# Patient Record
Sex: Male | Born: 1985 | Race: Black or African American | Hispanic: No | Marital: Single | Smoking: Former smoker
Health system: Southern US, Community
[De-identification: ages and names within clinical notes are randomized; demographics above are authoritative.]

## PROBLEM LIST (undated history)

## (undated) DIAGNOSIS — J45909 Unspecified asthma, uncomplicated: Secondary | ICD-10-CM

---

## 2010-05-23 ENCOUNTER — Ambulatory Visit: Payer: Self-pay | Admitting: Diagnostic Radiology

## 2010-05-23 ENCOUNTER — Emergency Department (HOSPITAL_BASED_OUTPATIENT_CLINIC_OR_DEPARTMENT_OTHER)
Admission: EM | Admit: 2010-05-23 | Discharge: 2010-05-23 | Payer: Self-pay | Source: Home / Self Care | Admitting: Emergency Medicine
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
## 2012-04-10 ENCOUNTER — Encounter (HOSPITAL_BASED_OUTPATIENT_CLINIC_OR_DEPARTMENT_OTHER): Payer: Self-pay | Admitting: *Deleted

## 2012-04-10 ENCOUNTER — Emergency Department (HOSPITAL_BASED_OUTPATIENT_CLINIC_OR_DEPARTMENT_OTHER)
Admission: EM | Admit: 2012-04-10 | Discharge: 2012-04-10 | Disposition: A | Discharge status: Home / Self Care | Payer: Self-pay | Attending: Emergency Medicine | Admitting: Emergency Medicine

## 2012-04-10 DIAGNOSIS — Z87891 Personal history of nicotine dependence: Secondary | ICD-10-CM | POA: Insufficient documentation

## 2012-04-10 DIAGNOSIS — S61219A Laceration without foreign body of unspecified finger without damage to nail, initial encounter: Secondary | ICD-10-CM

## 2012-04-10 DIAGNOSIS — S61209A Unspecified open wound of unspecified finger without damage to nail, initial encounter: Secondary | ICD-10-CM | POA: Insufficient documentation

## 2012-04-10 DIAGNOSIS — Z23 Encounter for immunization: Secondary | ICD-10-CM | POA: Insufficient documentation

## 2012-04-10 DIAGNOSIS — W278XXA Contact with other nonpowered hand tool, initial encounter: Secondary | ICD-10-CM | POA: Insufficient documentation

## 2012-04-10 MED ORDER — TETANUS-DIPHTH-ACELL PERTUSSIS 5-2.5-18.5 LF-MCG/0.5 IM SUSP
0.5000 mL | Freq: Once | INTRAMUSCULAR | Status: AC
  Administered 2012-04-10: 0.5 mL via INTRAMUSCULAR
  Filled 2012-04-10: qty 0.5

## 2012-04-10 NOTE — ED Provider Notes (Signed)
Medical screening examination/treatment/procedure(s) were performed by non-physician practitioner and as supervising physician I was immediately available for consultation/collaboration.  Ethelda Chick, MD 04/10/12 1331

## 2012-04-10 NOTE — ED Notes (Signed)
Patient has laceration from screwdriver on the left index finger; injury is covered with bandaid.

## 2012-04-10 NOTE — ED Provider Notes (Signed)
History     CSN: [id number]  Arrival date & time 04/10/12  1138   First MD Initiated Contact with Patient 04/10/12 1225      Chief Complaint  Patient presents with  . Finger Injury    (Consider location/radiation/quality/duration/timing/severity/associated sxs/prior treatment) HPI Patient since emergency department with laceration to his left index finger that occurred last night, around 8 PM.  Patient, states his tetanus shot is not up to date patient denies any numbness, or weakness in the finger.  Patient, states he has full range of motion of the finger.  History reviewed. No pertinent past medical history.  History reviewed. No pertinent past surgical history.  No family history on file.  History  Substance Use Topics  . Smoking status: Former Games developer  . Smokeless tobacco: Not on file  . Alcohol Use: Yes     occassionally      Review of Systems All other systems negative except as documented in the HPI. All pertinent positives and negatives as reviewed in the HPI.  Allergies  Review of patient's allergies indicates no known allergies.  Home Medications  No current outpatient prescriptions on file.  BP 118/64  Pulse 86  Temp 98.7 F (37.1 C) (Oral)  Resp 18  Ht 5\' 6"  (1.676 m)  Wt 135 lb (61.236 kg)  BMI 21.79 kg/m2  SpO2 99%  Physical Exam  Nursing note and vitals reviewed. Constitutional: He appears well-developed and well-nourished.  HENT:  Head: Normocephalic and atraumatic.  Cardiovascular: Normal rate and regular rhythm.   Pulmonary/Chest: Effort normal and breath sounds normal.  Musculoskeletal:       Left hand: He exhibits laceration. He exhibits normal two-point discrimination. normal sensation noted. Normal strength noted.       Hands:   ED Course  Procedures (including critical care time)  Patient had late presentation since the onset of the injury was 8 PM last night.  Patient's over 12 hours since injury time and will have  Steri-Strips placed along with wound cleansing and care.  Patient is advised return here as needed.  Tetanus shot will be updated   MDM          Carlyle Dolly, PA-C 04/10/12 1324

## 2012-04-10 NOTE — ED Notes (Signed)
Patient states he cut his left index finger last night while working on his car.

## 2017-02-14 ENCOUNTER — Encounter (HOSPITAL_BASED_OUTPATIENT_CLINIC_OR_DEPARTMENT_OTHER): Payer: Self-pay | Admitting: *Deleted

## 2017-02-14 ENCOUNTER — Emergency Department (HOSPITAL_BASED_OUTPATIENT_CLINIC_OR_DEPARTMENT_OTHER): Payer: Self-pay

## 2017-02-14 ENCOUNTER — Emergency Department (HOSPITAL_BASED_OUTPATIENT_CLINIC_OR_DEPARTMENT_OTHER)
Admission: EM | Admit: 2017-02-14 | Discharge: 2017-02-14 | Disposition: A | Discharge status: Home / Self Care | Payer: Self-pay | Attending: Emergency Medicine | Admitting: Emergency Medicine

## 2017-02-14 DIAGNOSIS — J45909 Unspecified asthma, uncomplicated: Secondary | ICD-10-CM | POA: Insufficient documentation

## 2017-02-14 DIAGNOSIS — Z87891 Personal history of nicotine dependence: Secondary | ICD-10-CM | POA: Insufficient documentation

## 2017-02-14 DIAGNOSIS — R0789 Other chest pain: Secondary | ICD-10-CM | POA: Insufficient documentation

## 2017-02-14 HISTORY — DX: Unspecified asthma, uncomplicated: J45.909

## 2017-02-14 MED ORDER — IBUPROFEN 800 MG PO TABS
800.0000 mg | ORAL_TABLET | Freq: Once | ORAL | Status: AC
  Administered 2017-02-14: 800 mg via ORAL
  Filled 2017-02-14: qty 1

## 2017-02-14 MED ORDER — TRAMADOL HCL 50 MG PO TABS: 50.0000 mg | ORAL_TABLET | Freq: Four times a day (QID) | ORAL | 0 refills | Status: AC | PRN

## 2017-02-14 NOTE — Discharge Instructions (Signed)
Apply ice several times a day. Take two naproxen tablets at a time, twice a day. Take acetaminophen every four hours as needed for additional pain relief. Reserve tramadol for severe pain, or for help sleeping.

## 2017-02-14 NOTE — ED Triage Notes (Addendum)
Pt c/o sudden onset of right chest  Wall pain with heavy lifting x 8 hrs ago

## 2017-02-14 NOTE — ED Notes (Signed)
Patient transported to X-ray 

## 2017-02-14 NOTE — ED Provider Notes (Signed)
[facility]   CSN: [id number] Arrival date & time: 02/14/17  1446     History   Chief Complaint Chief Complaint  Patient presents with  . Chest Pain    right side    HPI Michael MorgansRasheik D Edmiston is a 31 y.o. male.  The history is provided by the patient.  He woke up this morning with pain in the right anterior chest wall area. Pain got worse when he did some lifting at work, and he felt like he couldn't catch his breath. His work does involve lifting, but he cannot recall any specific thing he did that may hurt his chest. He rates pain at 6/10. Pain is sharp and worse with a deep breath and worse with movement. It is also worse with movement of his right arm. He denies cough. He denies nausea, vomiting, diaphoresis. He is a nonsmoker with no history of diabetes or hypertension or hyperlipidemia. No family history of premature coronary atherosclerosis. No history of recent surgery or long distance travel. No history of prior DVT or PE.  Past Medical History:  Diagnosis Date  . Asthma     There are no active problems to display for this patient.   History reviewed. No pertinent surgical history.     Home Medications    Prior to Admission medications   Not on File    Family History No family history on file.  Social History Social History  Substance Use Topics  . Smoking status: Former Games developermoker  . Smokeless tobacco: Not on file  . Alcohol use Yes     Comment: occassionally     Allergies   Patient has no known allergies.   Review of Systems Review of Systems  All other systems reviewed and are negative.    Physical Exam Updated Vital Signs BP 127/90 (BP Location: Right Arm)   Pulse 85   Temp 98.6 F (37 C) (Oral)   Resp 20   Ht 5\' 6"  (1.676 m)   Wt 63.5 kg (140 lb)   SpO2 100%   BMI 22.60 kg/m   Physical Exam  Nursing note and vitals reviewed.  Uncomfortable appearing 31 year old male, in no acute distress. Vital signs are  normal. Oxygen saturation is 100%, which is normal. Head is normocephalic and atraumatic. PERRLA, EOMI. Oropharynx is clear. Neck is nontender and supple without adenopathy or JVD. Back is nontender and there is no CVA tenderness. Lungs are clear without rales, wheezes, or rhonchi. Chest is tender over the right pectoralis musculature. Pain is reproduced by passive movement at the right shoulder. Heart has regular rate and rhythm without murmur. Abdomen is soft, flat, nontender without masses or hepatosplenomegaly and peristalsis is normoactive. Extremities have no cyanosis or edema, full range of motion is present. Skin is warm and dry without rash. Neurologic: Mental status is normal, cranial nerves are intact, there are no motor or sensory deficits.  ED Treatments / Results   Radiology No results found.  Procedures Procedures (including critical care time)  Medications Ordered in ED Medications  ibuprofen (ADVIL,MOTRIN) tablet 800 mg (not administered)     Initial Impression / Assessment and Plan / ED Course  I have reviewed the triage vital signs and the nursing notes.  Pertinent imaging results that were available during my care of the patient were reviewed by me and considered in my medical decision making (see chart for details).  Right-sided chest pain which appears to be musculoskeletal and centered in the  pectoralis musculature. Old records are reviewed, and he has no relevant past visits. PERC negative in [place] negative. He is given a dose of ibuprofen and will be sent for chest x-ray. No indication for ECG her blood work. Old records are reviewed, and he has no relevant past visits.  Chest x-ray shows no acute process. He is discharged with prescription for tramadol, but advised to use naproxen and acetaminophen as his primary medications for pain relief. Advised on using ice several times a day. Return precautions discussed.  Final Clinical Impressions(s) / ED  Diagnoses   Final diagnoses:  Chest wall pain    New Prescriptions New Prescriptions   TRAMADOL (ULTRAM) 50 MG TABLET    Take 1 tablet (50 mg total) by mouth every 6 (six) hours as needed.     Dione Booze, MD 02/14/17 [phone]

## 2018-05-26 ENCOUNTER — Other Ambulatory Visit: Payer: Self-pay

## 2018-05-26 ENCOUNTER — Emergency Department (HOSPITAL_BASED_OUTPATIENT_CLINIC_OR_DEPARTMENT_OTHER): Payer: Self-pay

## 2018-05-26 ENCOUNTER — Emergency Department (HOSPITAL_BASED_OUTPATIENT_CLINIC_OR_DEPARTMENT_OTHER)
Admission: EM | Admit: 2018-05-26 | Discharge: 2018-05-26 | Disposition: A | Discharge status: Home / Self Care | Payer: Self-pay | Attending: Emergency Medicine | Admitting: Emergency Medicine

## 2018-05-26 ENCOUNTER — Encounter (HOSPITAL_BASED_OUTPATIENT_CLINIC_OR_DEPARTMENT_OTHER): Payer: Self-pay

## 2018-05-26 DIAGNOSIS — R55 Syncope and collapse: Secondary | ICD-10-CM | POA: Insufficient documentation

## 2018-05-26 DIAGNOSIS — R0789 Other chest pain: Secondary | ICD-10-CM | POA: Insufficient documentation

## 2018-05-26 LAB — BASIC METABOLIC PANEL
Anion gap: 6 (ref 5–15)
BUN: 13 mg/dL (ref 6–20)
CO2: 28 mmol/L (ref 22–32)
CREATININE: 1.17 mg/dL (ref 0.61–1.24)
Calcium: 8.4 mg/dL — ABNORMAL LOW (ref 8.9–10.3)
Chloride: 105 mmol/L (ref 98–111)
GFR calc Af Amer: >60 mL/min (ref >60)
GLUCOSE: 98 mg/dL (ref 70–99)
POTASSIUM: 3.7 mmol/L (ref 3.5–5.1)
SODIUM: 139 mmol/L (ref 135–145)

## 2018-05-26 LAB — CBC WITH DIFFERENTIAL/PLATELET
Abs Immature Granulocytes: 0.02 10*3/uL (ref 0.00–0.07)
BASOS ABS: 0 10*3/uL (ref 0.0–0.1)
BASOS PCT: 1 %
EOS ABS: 0.3 10*3/uL (ref 0.0–0.5)
EOS PCT: 5 %
HCT: 42 % (ref 39.0–52.0)
Hemoglobin: 13.2 g/dL (ref 13.0–17.0)
Immature Granulocytes: 0 %
Lymphocytes Relative: 33 %
Lymphs Abs: 1.9 10*3/uL (ref 0.7–4.0)
MCH: 23.6 pg — AB (ref 26.0–34.0)
MCHC: 31.4 g/dL (ref 30.0–36.0)
MCV: 75.1 fL — ABNORMAL LOW (ref 80.0–100.0)
MONO ABS: 0.5 10*3/uL (ref 0.1–1.0)
Monocytes Relative: 10 %
NRBC: 0 % (ref 0.0–0.2)
Neutro Abs: 2.9 10*3/uL (ref 1.7–7.7)
Neutrophils Relative %: 51 %
PLATELETS: 143 10*3/uL — AB (ref 150–400)
RBC: 5.59 MIL/uL (ref 4.22–5.81)
RDW: 13.5 % (ref 11.5–15.5)
WBC: 5.6 10*3/uL (ref 4.0–10.5)

## 2018-05-26 LAB — URINALYSIS, ROUTINE W REFLEX MICROSCOPIC
BILIRUBIN URINE: NEGATIVE
GLUCOSE, UA: NEGATIVE mg/dL
Hgb urine dipstick: NEGATIVE
Ketones, ur: NEGATIVE mg/dL
Leukocytes, UA: NEGATIVE
Nitrite: NEGATIVE
PROTEIN: NEGATIVE mg/dL
Specific Gravity, Urine: 1.025 (ref 1.005–1.030)
pH: 7 (ref 5.0–8.0)

## 2018-05-26 LAB — RAPID URINE DRUG SCREEN, HOSP PERFORMED
AMPHETAMINES: NOT DETECTED
BARBITURATES: NOT DETECTED
BENZODIAZEPINES: NOT DETECTED
Cocaine: NOT DETECTED
Opiates: NOT DETECTED
TETRAHYDROCANNABINOL: NOT DETECTED

## 2018-05-26 LAB — TROPONIN I

## 2018-05-26 LAB — D-DIMER, QUANTITATIVE (NOT AT ARMC)

## 2018-05-26 MED ORDER — SODIUM CHLORIDE 0.9 % IV SOLN: INTRAVENOUS | Status: DC

## 2018-05-26 MED ORDER — SODIUM CHLORIDE 0.9 % IV BOLUS
1000.0000 mL | Freq: Once | INTRAVENOUS | Status: AC
  Administered 2018-05-26: 1000 mL via INTRAVENOUS

## 2018-05-26 NOTE — ED Notes (Signed)
Pt verbalizes understanding of d/c instructions and denies any further needs at this time. 

## 2018-05-26 NOTE — ED Provider Notes (Signed)
[facility] EMERGENCY DEPARTMENT Provider Note   CSN: [id number] Arrival date & time: 05/26/18  1536     History   Chief Complaint Chief Complaint  Patient presents with  . Chest Pain    HPI Michael Nolan is a 32 y.o. male.  Pt presents to the ED today with syncope and CP.  Pt said he has had intermittent CP for 4 months.  He does not think anything causes the pain.  Today, he was at work and had the pain.  He said he went to the office to get something, leaned over, stood up, then possibly passed out.  The pt denies he passed out, but the wife told triage his work told her he passed out.  EMS was called, but he refused transport.  The pt denies cp or any sx now.  He said he's been eating and drinking ok.  He has not skipped any meals.  No SOB when he gets CP.     Past Medical History:  Diagnosis Date  . Asthma     There are no active problems to display for this patient.   History reviewed. No pertinent surgical history.      Home Medications    Prior to Admission medications   Medication Sig Start Date End Date Taking? Authorizing Provider  traMADol (ULTRAM) 50 MG tablet Take 1 tablet (50 mg total) by mouth every 6 (six) hours as needed. 02/14/17   Dione Booze, MD    Family History No family history on file.  Social History Social History   Tobacco Use  . Smoking status: Former Games developer  . Smokeless tobacco: Never Used  Substance Use Topics  . Alcohol use: Yes    Comment: occassionally  . Drug use: No     Allergies   Patient has no known allergies.   Review of Systems Review of Systems  Cardiovascular: Positive for chest pain.  Neurological: Positive for syncope.  All other systems reviewed and are negative.    Physical Exam Updated Vital Signs BP 115/89   Pulse 81   Temp 98.8 F (37.1 C) (Oral)   Resp 11   Ht 5\' 5"  (1.651 m)   Wt 65.8 kg   SpO2 100%   BMI 24.13 kg/m   Physical Exam  Constitutional: He is oriented to  person, place, and time. He appears well-developed and well-nourished.  HENT:  Head: Normocephalic and atraumatic.  Eyes: Pupils are equal, round, and reactive to light. EOM are normal.  Neck: Normal range of motion. Neck supple.  Cardiovascular: Normal rate, regular rhythm, intact distal pulses and normal pulses.  Pulmonary/Chest: Effort normal and breath sounds normal.  Abdominal: Soft. Bowel sounds are normal.  Musculoskeletal: Normal range of motion.       Right lower leg: Normal.       Left lower leg: Normal.  Neurological: He is alert and oriented to person, place, and time.  Skin: Skin is warm and dry. Capillary refill takes less than 2 seconds.  Psychiatric: He has a normal mood and affect. His behavior is normal.  Nursing note and vitals reviewed.    ED Treatments / Results  Labs (all labs ordered are listed, but only abnormal results are displayed) Labs Reviewed  CBC WITH DIFFERENTIAL/PLATELET - Abnormal; Notable for the following components:      Result Value   MCV 75.1 (*)    MCH 23.6 (*)    Platelets 143 (*)    All other components within  normal limits  BASIC METABOLIC PANEL - Abnormal; Notable for the following components:   Calcium 8.4 (*)    All other components within normal limits  RAPID URINE DRUG SCREEN, HOSP PERFORMED  TROPONIN I  URINALYSIS, ROUTINE W REFLEX MICROSCOPIC  D-DIMER, QUANTITATIVE (NOT AT [facility])    EKG EKG Interpretation  Date/Time:  Monday May 26 2018 15:44:52 EDT Ventricular Rate:  79 PR Interval:  132 QRS Duration: 92 QT Interval:  362 QTC Calculation: 415 R Axis:   83 Text Interpretation:  Normal sinus rhythm Normal ECG No old tracing to compare Confirmed by Jacalyn Lefevre [phone]) on 05/26/2018 3:54:30 PM   Radiology Dg Chest 2 View  Result Date: 05/26/2018 CLINICAL DATA:  Substernal chest pain x4 months. EXAM: CHEST - 2 VIEW COMPARISON:  02/14/2017 FINDINGS: The heart size and mediastinal contours are within normal  limits. Both lungs are clear. The visualized skeletal structures are unremarkable. IMPRESSION: No active cardiopulmonary disease. Electronically Signed   By: Tollie Eth M.D.   On: 05/26/2018 16:41    Procedures Procedures (including critical care time)  Medications Ordered in ED Medications  sodium chloride 0.9 % bolus 1,000 mL (1,000 mLs Intravenous New Bag/Given 05/26/18 1649)    And  0.9 %  sodium chloride infusion (has no administration in time range)     Initial Impression / Assessment and Plan / ED Course  I have reviewed the triage vital signs and the nursing notes.  Pertinent labs & imaging results that were available during my care of the patient were reviewed by me and considered in my medical decision making (see chart for details).     Pt has not had any CP since he's been here.  Etiology unclear, but unlikely cardiac.  Heart score of 0.  Syncopal episode likely orthostatic as it occurred after he bent over.  He is encouraged to drink lots of fluids.  Return if worse.  Final Clinical Impressions(s) / ED Diagnoses   Final diagnoses:  Atypical chest pain  Syncope, unspecified syncope type    ED Discharge Orders    None       Jacalyn Lefevre, MD 05/26/18 1758

## 2018-05-26 NOTE — ED Notes (Signed)
Pt is not very forthcoming with information as he and his visitor are on their cell phones when nurse walks in room. Pt c/o central chest pain that started at lunch. While visitor was on the phone, she disclosed that the patient passed out at work when he was getting up from a sitting position. Pt denies chest pain currently, is lying flat for orthostatic vital signs.

## 2018-05-26 NOTE — ED Triage Notes (Addendum)
C/o CP x 4 months-NAD-to triage in w/c-wife states pt's workplace told her he passed out-pt denies-EMS was called-pt refused transport-pt denies need for w/c-steady gait to ED WR

## 2018-05-26 NOTE — Discharge Instructions (Addendum)
Drink lots of fluids

## 2019-06-18 IMAGING — CR DG CHEST 2V
2 series · 2 of 2 positions shown · non-contrast
Comparison: 02/14/2017

CLINICAL DATA: Substernal chest pain x4 months.

EXAM:
CHEST - 2 VIEW

[w chest pa]
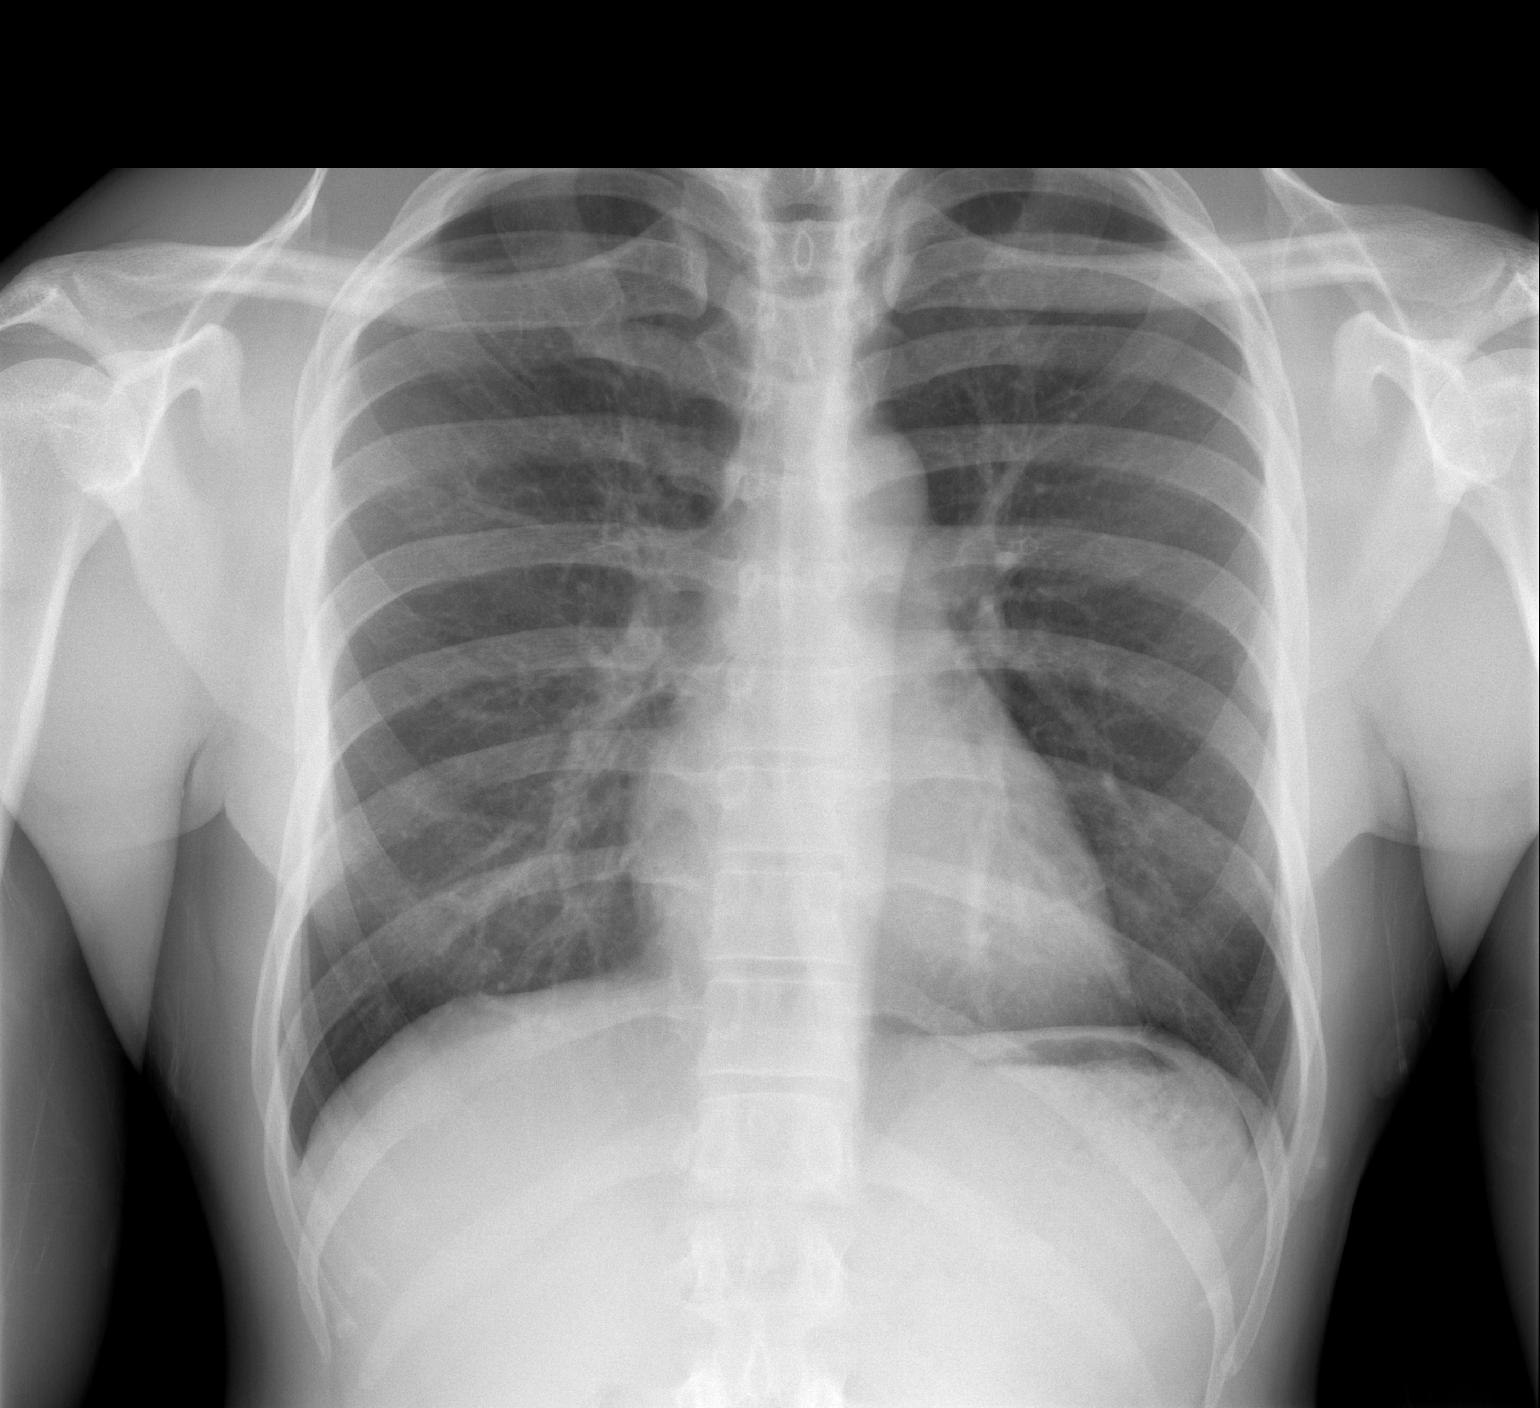

[w chest lat]
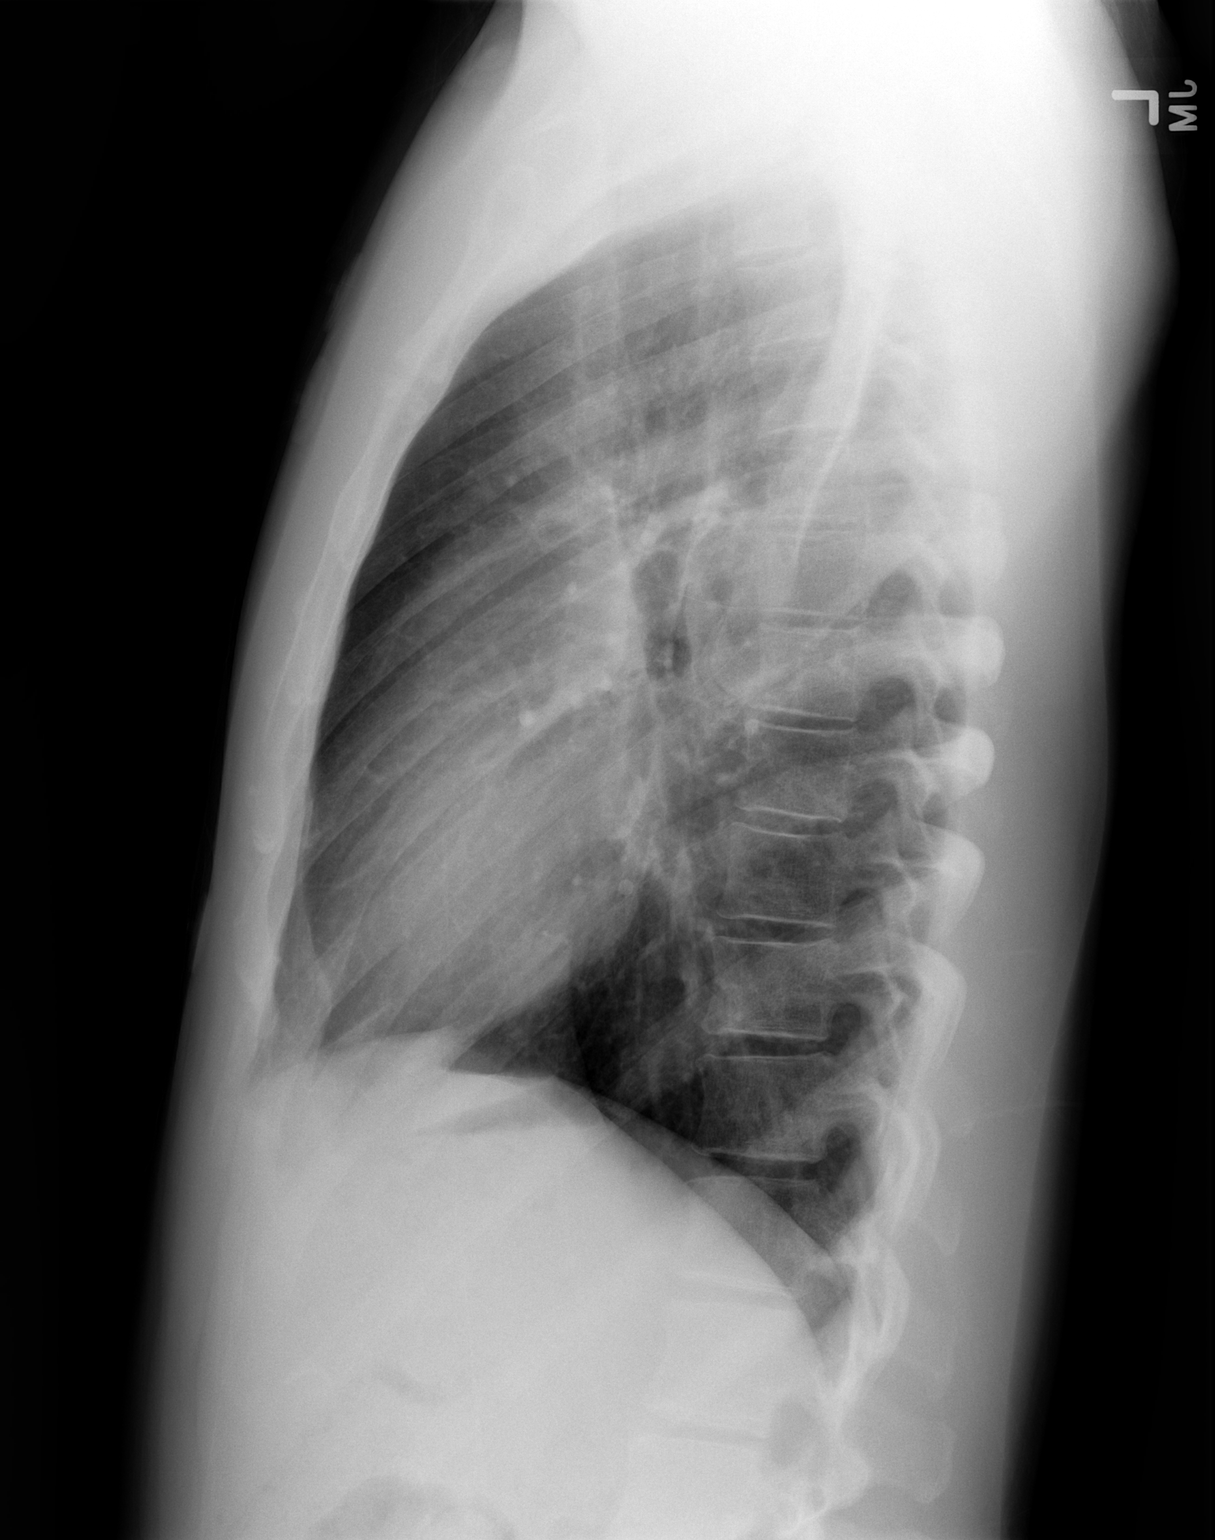

[2 of 2 positions shown; findings below may reference images not displayed]

FINDINGS: The heart size and mediastinal contours are within normal limits.
Both lungs are clear. The visualized skeletal structures are
unremarkable.
IMPRESSION: No active cardiopulmonary disease.

## 2021-08-19 ENCOUNTER — Emergency Department (HOSPITAL_BASED_OUTPATIENT_CLINIC_OR_DEPARTMENT_OTHER)
Admission: EM | Admit: 2021-08-19 | Discharge: 2021-08-19 | Disposition: A | Discharge status: Home / Self Care | Payer: Self-pay | Attending: Emergency Medicine | Admitting: Emergency Medicine

## 2021-08-19 ENCOUNTER — Other Ambulatory Visit: Payer: Self-pay

## 2021-08-19 ENCOUNTER — Encounter (HOSPITAL_BASED_OUTPATIENT_CLINIC_OR_DEPARTMENT_OTHER): Payer: Self-pay | Admitting: Emergency Medicine

## 2021-08-19 ENCOUNTER — Emergency Department (HOSPITAL_BASED_OUTPATIENT_CLINIC_OR_DEPARTMENT_OTHER): Payer: Self-pay

## 2021-08-19 DIAGNOSIS — U071 COVID-19: Secondary | ICD-10-CM | POA: Insufficient documentation

## 2021-08-19 DIAGNOSIS — J45909 Unspecified asthma, uncomplicated: Secondary | ICD-10-CM | POA: Insufficient documentation

## 2021-08-19 LAB — CBC
HCT: 40.5 % (ref 39.0–52.0)
Hemoglobin: 13.1 g/dL (ref 13.0–17.0)
MCH: 24.2 pg — ABNORMAL LOW (ref 26.0–34.0)
MCHC: 32.3 g/dL (ref 30.0–36.0)
MCV: 74.7 fL — ABNORMAL LOW (ref 80.0–100.0)
Platelets: 137 10*3/uL — ABNORMAL LOW (ref 150–400)
RBC: 5.42 MIL/uL (ref 4.22–5.81)
RDW: 13.2 % (ref 11.5–15.5)
WBC: 5.7 10*3/uL (ref 4.0–10.5)
nRBC: 0 % (ref 0.0–0.2)

## 2021-08-19 LAB — BASIC METABOLIC PANEL
Anion gap: 8 (ref 5–15)
BUN: 10 mg/dL (ref 6–20)
CO2: 24 mmol/L (ref 22–32)
Calcium: 8.5 mg/dL — ABNORMAL LOW (ref 8.9–10.3)
Chloride: 104 mmol/L (ref 98–111)
Creatinine, Ser: 1.15 mg/dL (ref 0.61–1.24)
GFR, Estimated: >60 mL/min (ref >60)
Glucose, Bld: 102 mg/dL — ABNORMAL HIGH (ref 70–99)
Potassium: 4 mmol/L (ref 3.5–5.1)
Sodium: 136 mmol/L (ref 135–145)

## 2021-08-19 LAB — TROPONIN I (HIGH SENSITIVITY): Troponin I (High Sensitivity): <2 ng/L (ref <18)

## 2021-08-19 LAB — RESP PANEL BY RT-PCR (FLU A&B, COVID) ARPGX2
Influenza A by PCR: NEGATIVE
Influenza B by PCR: NEGATIVE
SARS Coronavirus 2 by RT PCR: POSITIVE — AB

## 2021-08-19 LAB — GROUP A STREP BY PCR: Group A Strep by PCR: NOT DETECTED

## 2021-08-19 MED ORDER — IBUPROFEN 600 MG PO TABS: 600.0000 mg | ORAL_TABLET | Freq: Four times a day (QID) | ORAL | 0 refills | Status: AC | PRN

## 2021-08-19 MED ORDER — HYDROCODONE-ACETAMINOPHEN 5-325 MG PO TABS
1.0000 | ORAL_TABLET | Freq: Once | ORAL | Status: AC
  Administered 2021-08-19: 1 via ORAL
  Filled 2021-08-19: qty 1

## 2021-08-19 MED ORDER — NIRMATRELVIR/RITONAVIR (PAXLOVID)TABLET: 3.0000 | ORAL_TABLET | Freq: Two times a day (BID) | ORAL | 0 refills | Status: AC

## 2021-08-19 MED ORDER — KETOROLAC TROMETHAMINE 30 MG/ML IJ SOLN
30.0000 mg | Freq: Once | INTRAMUSCULAR | Status: AC
Start: 2021-08-19 — End: 2021-08-19
  Administered 2021-08-19: 30 mg via INTRAVENOUS
  Filled 2021-08-19: qty 1

## 2021-08-19 MED ORDER — SODIUM CHLORIDE 0.9 % IV BOLUS
1000.0000 mL | Freq: Once | INTRAVENOUS | Status: AC
  Administered 2021-08-19: 1000 mL via INTRAVENOUS

## 2021-08-19 MED ORDER — HYDROCODONE-ACETAMINOPHEN 5-325 MG PO TABS: 1.0000 | ORAL_TABLET | ORAL | 0 refills | Status: AC | PRN

## 2021-08-19 NOTE — ED Triage Notes (Signed)
CP and SOB X 3  days worsens when eating or drinking developed head and body pain last night.

## 2021-08-19 NOTE — ED Provider Notes (Signed)
[facility] EMERGENCY DEPARTMENT Provider Note   CSN: [id number] Arrival date & time: 08/19/21  0827     History  Chief Complaint  Patient presents with   Chest Pain   Shortness of Breath    Michael Nolan is a 36 y.o. male.  Pt is a 36 yo bm with a hx of asthma.  He said he has had cp and sob for 3 days.  He said his whole body aches today.  He denies any sick contacts.  He took some tylenol early this morning which helped a little.      Home Medications Prior to Admission medications   Medication Sig Start Date End Date Taking? Authorizing Provider  HYDROcodone-acetaminophen (NORCO/VICODIN) 5-325 MG tablet Take 1 tablet by mouth every 4 (four) hours as needed. 08/19/21  Yes Michael Lefevre, MD  ibuprofen (ADVIL) 600 MG tablet Take 1 tablet (600 mg total) by mouth every 6 (six) hours as needed. 08/19/21  Yes Michael Lefevre, MD  nirmatrelvir/ritonavir EUA (PAXLOVID) 20 x 150 MG & 10 x 100MG  TABS Take 3 tablets by mouth 2 (two) times daily for 5 days. Patient GFR is >60. Take nirmatrelvir (150 mg) two tablets twice daily for 5 days and ritonavir (100 mg) one tablet twice daily for 5 days. 08/19/21 08/24/21 Yes 08/26/21, MD  traMADol (ULTRAM) 50 MG tablet Take 1 tablet (50 mg total) by mouth every 6 (six) hours as needed. 02/14/17   02/16/17, MD      Allergies    Patient has no known allergies.    Review of Systems   Review of Systems  Constitutional:  Positive for fatigue.  HENT:  Positive for congestion.   Respiratory:  Positive for shortness of breath.   Cardiovascular:  Positive for chest pain.  Musculoskeletal:  Positive for myalgias.  All other systems reviewed and are negative.  Physical Exam Updated Vital Signs BP 99/70    Pulse 71    Temp 98.7 F (37.1 C) (Oral)    Resp 16    Ht 5\' 5"  (1.651 m)    Wt 68 kg    SpO2 100%    BMI 24.96 kg/m  Physical Exam Vitals and nursing note reviewed.  Constitutional:      Appearance: He is well-developed.   HENT:     Head: Normocephalic and atraumatic.  Eyes:     Extraocular Movements: Extraocular movements intact.     Pupils: Pupils are equal, round, and reactive to light.  Cardiovascular:     Rate and Rhythm: Normal rate and regular rhythm.     Heart sounds: Normal heart sounds.  Pulmonary:     Effort: Pulmonary effort is normal.     Breath sounds: Normal breath sounds.  Abdominal:     General: Bowel sounds are normal.     Palpations: Abdomen is soft.  Musculoskeletal:        General: Normal range of motion.     Cervical back: Normal range of motion and neck supple.  Skin:    General: Skin is warm.     Capillary Refill: Capillary refill takes less than 2 seconds.  Neurological:     General: No focal deficit present.     Mental Status: He is alert and oriented to person, place, and time.  Psychiatric:        Mood and Affect: Mood normal.        Behavior: Behavior normal.    ED Results / Procedures / Treatments  Labs (all labs ordered are listed, but only abnormal results are displayed) Labs Reviewed  RESP PANEL BY RT-PCR (FLU A&B, COVID) ARPGX2 - Abnormal; Notable for the following components:      Result Value   SARS Coronavirus 2 by RT PCR POSITIVE (*)    All other components within normal limits  BASIC METABOLIC PANEL - Abnormal; Notable for the following components:   Glucose, Bld 102 (*)    Calcium 8.5 (*)    All other components within normal limits  CBC - Abnormal; Notable for the following components:   MCV 74.7 (*)    MCH 24.2 (*)    Platelets 137 (*)    All other components within normal limits  GROUP A STREP BY PCR  TROPONIN I (HIGH SENSITIVITY)  TROPONIN I (HIGH SENSITIVITY)    EKG EKG Interpretation  Date/Time:  Saturday August 19 2021 08:38:13 EST Ventricular Rate:  82 PR Interval:  130 QRS Duration: 85 QT Interval:  347 QTC Calculation: 406 R Axis:   72 Text Interpretation: Sinus rhythm RSR' in V1 or V2, probably normal variant ST elev,  probable normal early repol pattern No significant change since last tracing Confirmed by Michael Nolan, Michael Nolan [phone]) on 08/19/2021 9:47:00 AM  Radiology DG Chest Portable 1 View  Result Date: 08/19/2021 CLINICAL DATA:  f chest pain and shortness of breath. EXAM: PORTABLE CHEST 1 VIEW COMPARISON:  Chest radiograph 05/26/2018 FINDINGS: The heart size and mediastinal contours are within normal limits. Both lungs are clear. The visualized skeletal structures are unremarkable. IMPRESSION: No active disease. Electronically Signed   By: Annia Beltrew  Davis M.D.   On: 08/19/2021 09:55    Procedures Procedures    Medications Ordered in ED Medications  HYDROcodone-acetaminophen (NORCO/VICODIN) 5-325 MG per tablet 1 tablet (has no administration in time range)  sodium chloride 0.9 % bolus 1,000 mL (0 mLs Intravenous Stopped 08/19/21 1003)  ketorolac (TORADOL) 30 MG/ML injection 30 mg (30 mg Intravenous Given 08/19/21 0902)    ED Course/ Medical Decision Making/ A&P                           Medical Decision Making Amount and/or Complexity of Data Reviewed Labs: ordered. Radiology: ordered.  Risk Prescription drug management.   Pt is + for Covid-19.  He has been vaccinated, but not boosted.  He is interested in taking Paxlovid.  His renal function is normal.  CP is likely due to covid. CXR is clear and he is not hypoxic or tachycardic, so I doubt PE.  Pt is stable for d/c.  He is to return if worse.    [name] was evaluated in Emergency Department on 08/19/2021 for the symptoms described in the history of present illness. He was evaluated in the context of the global COVID-19 pandemic, which necessitated consideration that the patient might be at risk for infection with the SARS-CoV-2 virus that causes COVID-19. Institutional protocols and algorithms that pertain to the evaluation of patients at risk for COVID-19 are in a state of rapid change based on information released by regulatory bodies  including the CDC and federal and state organizations. These policies and algorithms were followed during the patient's care in the ED.         Final Clinical Impression(s) / ED Diagnoses Final diagnoses:  COVID-19    Rx / DC Orders ED Discharge Orders          Ordered    nirmatrelvir/ritonavir EUA (  PAXLOVID) 20 x 150 MG & 10 x 100MG  TABS  2 times daily        08/19/21 1031    ibuprofen (ADVIL) 600 MG tablet  Every 6 hours PRN        08/19/21 1031    HYDROcodone-acetaminophen (NORCO/VICODIN) 5-325 MG tablet  Every 4 hours PRN        08/19/21 1031              08/21/21, MD 08/19/21 1033

## 2021-08-19 NOTE — ED Notes (Signed)
ED Provider at bedside. 

## 2022-08-19 ENCOUNTER — Encounter (HOSPITAL_BASED_OUTPATIENT_CLINIC_OR_DEPARTMENT_OTHER): Payer: Self-pay

## 2022-08-19 ENCOUNTER — Other Ambulatory Visit: Payer: Self-pay

## 2022-08-19 DIAGNOSIS — K610 Anal abscess: Secondary | ICD-10-CM | POA: Insufficient documentation

## 2022-08-19 NOTE — ED Triage Notes (Signed)
Pt reports abscess close to rectum for past 4 days, gradually worsening. Denies fever. Pt reports drainage that is yellow. Pt reports history of abscess in same area that required drainage.

## 2022-08-20 ENCOUNTER — Emergency Department (HOSPITAL_BASED_OUTPATIENT_CLINIC_OR_DEPARTMENT_OTHER)
Admission: EM | Admit: 2022-08-20 | Discharge: 2022-08-20 | Disposition: A | Discharge status: Home / Self Care | Payer: Self-pay | Attending: Emergency Medicine | Admitting: Emergency Medicine

## 2022-08-20 DIAGNOSIS — K610 Anal abscess: Secondary | ICD-10-CM

## 2022-08-20 MED ORDER — DOXYCYCLINE HYCLATE 100 MG PO TABS
100.0000 mg | ORAL_TABLET | Freq: Once | ORAL | Status: AC
Start: 2022-08-20 — End: 2022-08-20
  Administered 2022-08-20: 100 mg via ORAL
  Filled 2022-08-20: qty 1

## 2022-08-20 MED ORDER — DOXYCYCLINE HYCLATE 100 MG PO CAPS: 100.0000 mg | ORAL_CAPSULE | Freq: Two times a day (BID) | ORAL | 0 refills | Status: AC

## 2022-08-20 NOTE — ED Provider Notes (Signed)
   [facility]  Provider Note  CSN: [id number] Arrival date & time: 08/19/22 2329  History Chief Complaint  Patient presents with   Abscess    Michael Nolan is a 37 y.o. male with no significant PMH reports he has had a boil in his rectal area for a few days began draining pus earlier today. No fevers. Pain with defecation.    Home Medications Prior to Admission medications   Medication Sig Start Date End Date Taking? Authorizing Provider  doxycycline (VIBRAMYCIN) 100 MG capsule Take 1 capsule (100 mg total) by mouth 2 (two) times daily. 08/20/22  Yes Truddie Hidden, MD  HYDROcodone-acetaminophen (NORCO/VICODIN) 5-325 MG tablet Take 1 tablet by mouth every 4 (four) hours as needed. 08/19/21   Isla Pence, MD  ibuprofen (ADVIL) 600 MG tablet Take 1 tablet (600 mg total) by mouth every 6 (six) hours as needed. 08/19/21   Isla Pence, MD  traMADol (ULTRAM) 50 MG tablet Take 1 tablet (50 mg total) by mouth every 6 (six) hours as needed. 04/22/25   Delora Fuel, MD     Allergies    Patient has no known allergies.   Review of Systems   Review of Systems Please see HPI for pertinent positives and negatives  Physical Exam BP (!) 131/94   Pulse 77   Temp 98 F (36.7 C) (Oral)   Resp 15   Ht 5\' 5"  (1.651 m)   Wt 65.8 kg   SpO2 98%   BMI 24.13 kg/m   Physical Exam Vitals and nursing note reviewed.  HENT:     Head: Normocephalic.     Nose: Nose normal.  Eyes:     Extraocular Movements: Extraocular movements intact.  Pulmonary:     Effort: Pulmonary effort is normal.  Genitourinary:    Comments: Chaperone present. There is a spontaneously draining perianal abscess on the left. No significant remaining fluctuance, moderate induration and tenderness Musculoskeletal:        General: Normal range of motion.     Cervical back: Neck supple.  Skin:    Findings: No rash (on exposed skin).  Neurological:     Mental Status:  He is alert and oriented to person, place, and time.  Psychiatric:        Mood and Affect: Mood normal.     ED Results / Procedures / Treatments   EKG None  Procedures Procedures  Medications Ordered in the ED Medications  doxycycline (VIBRA-TABS) tablet 100 mg (has no administration in time range)    Initial Impression and Plan  Patient here with a spontaneously draining perianal abscess. Not in need of further incision at this time. Will plan Rx for doxycycline. Recommend sitz baths, antibacterial soap and PCP follow up. RTED if swelling increases, fevers, pain or other concerns.   ED Course       MDM Rules/Calculators/A&P Medical Decision Making Problems Addressed: Perianal abscess: acute illness or injury  Risk Prescription drug management.     Final Clinical Impression(s) / ED Diagnoses Final diagnoses:  Perianal abscess    Rx / DC Orders ED Discharge Orders          Ordered    doxycycline (VIBRAMYCIN) 100 MG capsule  2 times daily        08/20/22 0158             Truddie Hidden, MD 08/20/22 [phone]

## 2022-08-20 NOTE — ED Notes (Signed)
Education provided regarding sitz baths and using antibacterial soap.

## 2022-08-20 NOTE — ED Notes (Signed)
Pt getting undressed.
